# Patient Record
Sex: Male | Born: 1975 | Race: White | Hispanic: No | Marital: Married | Smoking: Current every day smoker
Health system: Southern US, Community
[De-identification: ages and names within clinical notes are randomized; demographics above are authoritative.]

## PROBLEM LIST (undated history)

## (undated) HISTORY — PX: FEMUR CLOSED REDUCTION: SHX939

---

## 2008-06-10 ENCOUNTER — Emergency Department (HOSPITAL_COMMUNITY): Admission: EM | Admit: 2008-06-10 | Discharge: 2008-06-10 | Payer: Self-pay | Admitting: Emergency Medicine

## 2008-10-21 ENCOUNTER — Encounter: Admission: RE | Admit: 2008-10-21 | Discharge: 2008-12-03 | Payer: Self-pay | Admitting: Anesthesiology

## 2008-11-19 ENCOUNTER — Ambulatory Visit: Payer: Self-pay | Admitting: Anesthesiology

## 2008-12-03 ENCOUNTER — Ambulatory Visit: Payer: Self-pay | Admitting: Anesthesiology

## 2009-06-09 ENCOUNTER — Encounter: Admission: RE | Admit: 2009-06-09 | Discharge: 2009-06-09 | Payer: Self-pay | Admitting: Emergency Medicine

## 2010-05-23 ENCOUNTER — Emergency Department (HOSPITAL_COMMUNITY): Admission: EM | Admit: 2010-05-23 | Discharge: 2010-05-23 | Payer: Self-pay | Admitting: Emergency Medicine

## 2011-05-04 NOTE — Assessment & Plan Note (Signed)
PRIMARY CARE PHYSICIAN:  Ellie Lunch, MD   Arthur Castaneda comes to [facility] through the [employer], working for [employer].  Apparently, he injured his low back, and  parathoracic region.  He has been evaluated through orthopedic  colleagues and he has had some interventions, none of which was  particularly helpful.  He has been on quite a bit of hydrocodone from  multiple sources and we have reviewed that, we have done adherence  monitoring, reviewed with him.  He is an individual who comes to [place] with  records that I review as well, review patient care, agreement, health  and history form.  He is currently working, but is relating his pain as  7/10, mostly parathoracic, made worse by standing, most activity,  walking, and bending.  He is able to walk with assistance, continues to  drive.  He is employed 35 hours a week, he states that is difficult.  He  is married, he has kids.   PAST MEDICAL HISTORY:  Remarkable for right elbow fracture, primary care  physician is Dr. Renae Fickle.  His primary care physician and Orthopedics have  been providing him with his pain control and he has also got what  appears to be urgent care.  He is denying any chemical alcohol  dependency; denying illicit drug use; and review of systems, family and  social history, otherwise noncontributory to pain problem.   PHYSICAL EXAMINATION:  GENERAL:  Pleasant male resting comfortably in  bed.  Gait affect appears normal, he is oriented x3.  HEENT:  Unremarkable.  CHEST:  Clear to auscultation and percussion.  HEART:  Regular rate and rhythm without rub, murmur, or gallop.  ABDOMINAL:  Benign.  No hepatosplenomegaly.  He has diffuse parathoracic  myofascial discomfort, some distractability, pain with side bending,  pain with extension.  Intact from a neurological prospective.   IMPRESSION:  Myofascial pain syndrome, degenerative spondylosis of  thoracic spine.   PLAN:  1. He may have a  spondylytic pain, and he does have some bulging disk      in the mid thoracic region, but I do not believe he is a candidate      for control substances over a prolonged period of time.  I do not      believe he would be a candidate for a pharmacokinetically long-      acting agents either, with the exception of non-narcotic medication      alternatives, [place] may be a good choice.  2. Full informed consent.  We asked for UDS today, and he took him a      long time to produce this, and it looks like water and his      temperature is 92 degrees.  He understands I may have to sent that      off to be confirmed.  3. He is probably a candidate for acupuncture and minimally invasive      approaches possibly even a muscle stimulator for agitation.  I do      not think further interventions are going to be of particular      value.  I see no disabling characteristics.  I see essentially      modest-to-minor myofascial pain, and believe he will have an      overall good outcome.  Emphasize enabling behaviors as opposed to      disabling behavior, age is a very positive factor, and      reinforcement  is given here.  We will have him see our Physiatry      colleagues and I have talked him with Dr. Wynn Banker, and I do not      believe we will get into any controlled substances, we will      probably recast him at next visit.           ______________________________  Celene Kras, MD     HH/MedQ  D:  11/19/2008 11:35:34  T:  11/20/2008 01:08:29  Job #:  [id number]

## 2011-05-04 NOTE — Assessment & Plan Note (Signed)
Arthur Castaneda comes to the [facility] today.  I  evaluated his Health and History form and 14-point review of systems,  chaperoned visit.   1. Arthur Castaneda comes in today and I reviewed his UDS with him.  It is      essentially water, and he admits to that, he states he could not      produce urine.  Further discussions, apparently he used his      mother's medications, and I counseled as to the danger of doing      this, and that this type of activity is contradictory to our      ability to continue treating him.  2. I also wanted to enable him.  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  He should be full active, but he      has quite a bit of pain behavior, and I would like him to focus on      returning to full active.  3. Other modifiable features and health profile discussed.  He talks      about getting another FCE, more imaging and the like and I just do      not think it is clinically indicated at this time.  He has a modest      myofascial spondylitic pain, and I want him to be as functional as      possible, and he continues to work, I see no reason he cannot be      full active here.  He has had a previous FCE.   Objectively, mostly a counseling session, myofascial pain.  Nothing new  neurologically.   IMPRESSION:  Spondylosis with myofascial pain and otherwise unchanged.   PLAN:  Unfortunately, we are going to have to discharge him.  We  counseled him.  Offered him drug treatment if he would like, as there  are many red flags, and he declines.  We will not be able to continue  with him and if he wants to go through drug treatment and focus as to  best outcome predictive elements, we might rethink this, but at this  time, I am not going to have a lot to offer.     ______________________________  Celene Kras, MD     HH/MedQ  D:  12/03/2008 11:07:55  T:  12/04/2008 05:35:55  Job #:  [id number]

## 2016-02-12 ENCOUNTER — Emergency Department (HOSPITAL_COMMUNITY)
Admission: EM | Admit: 2016-02-12 | Discharge: 2016-02-13 | Disposition: A | Discharge status: Home / Self Care | Payer: Worker's Compensation | Attending: Emergency Medicine | Admitting: Emergency Medicine

## 2016-02-12 ENCOUNTER — Encounter (HOSPITAL_COMMUNITY): Payer: Self-pay | Admitting: Emergency Medicine

## 2016-02-12 ENCOUNTER — Emergency Department (HOSPITAL_COMMUNITY): Payer: Worker's Compensation

## 2016-02-12 DIAGNOSIS — S92001A Unspecified fracture of right calcaneus, initial encounter for closed fracture: Secondary | ICD-10-CM

## 2016-02-12 DIAGNOSIS — M545 Low back pain, unspecified: Secondary | ICD-10-CM

## 2016-02-12 DIAGNOSIS — F17210 Nicotine dependence, cigarettes, uncomplicated: Secondary | ICD-10-CM | POA: Insufficient documentation

## 2016-02-12 DIAGNOSIS — Y9289 Other specified places as the place of occurrence of the external cause: Secondary | ICD-10-CM | POA: Diagnosis not present

## 2016-02-12 DIAGNOSIS — Y9389 Activity, other specified: Secondary | ICD-10-CM | POA: Diagnosis not present

## 2016-02-12 DIAGNOSIS — S3992XA Unspecified injury of lower back, initial encounter: Secondary | ICD-10-CM | POA: Diagnosis present

## 2016-02-12 DIAGNOSIS — S92061A Displaced intraarticular fracture of right calcaneus, initial encounter for closed fracture: Secondary | ICD-10-CM | POA: Insufficient documentation

## 2016-02-12 DIAGNOSIS — Y998 Other external cause status: Secondary | ICD-10-CM | POA: Insufficient documentation

## 2016-02-12 DIAGNOSIS — S32010A Wedge compression fracture of first lumbar vertebra, initial encounter for closed fracture: Secondary | ICD-10-CM | POA: Diagnosis not present

## 2016-02-12 DIAGNOSIS — W11XXXA Fall on and from ladder, initial encounter: Secondary | ICD-10-CM | POA: Diagnosis not present

## 2016-02-12 MED ORDER — HYDROMORPHONE HCL 1 MG/ML IJ SOLN
1.0000 mg | Freq: Once | INTRAMUSCULAR | Status: AC
  Administered 2016-02-12: 1 mg via INTRAVENOUS
  Filled 2016-02-12: qty 1

## 2016-02-12 MED ORDER — OXYCODONE HCL 10 MG PO TABS: 10.0000 mg | ORAL_TABLET | ORAL | Status: AC | PRN

## 2016-02-12 MED ORDER — OXYCODONE HCL 5 MG PO TABS
10.0000 mg | ORAL_TABLET | Freq: Once | ORAL | Status: AC
  Administered 2016-02-12: 10 mg via ORAL
  Filled 2016-02-12: qty 2

## 2016-02-12 MED ORDER — ACETAMINOPHEN 500 MG PO TABS
1000.0000 mg | ORAL_TABLET | Freq: Once | ORAL | Status: AC
  Administered 2016-02-12: 1000 mg via ORAL
  Filled 2016-02-12: qty 2

## 2016-02-12 MED ORDER — FENTANYL CITRATE (PF) 100 MCG/2ML IJ SOLN
100.0000 ug | INTRAMUSCULAR | Status: AC | PRN
  Administered 2016-02-12 (×3): 100 ug via INTRAVENOUS
  Filled 2016-02-12 (×3): qty 2

## 2016-02-12 NOTE — ED Provider Notes (Signed)
CSN: [id number]     Arrival date & time 02/12/16  1550 History   First MD Initiated Contact with Patient 02/12/16 1550     Chief Complaint  Patient presents with  . Fall     (Consider location/radiation/quality/duration/timing/severity/associated sxs/prior Treatment) The history is provided by the patient and medical records. No language interpreter was used.     Arthur Castaneda is a 40 y.o. male  with no major medical problems presents to the Emergency Department complaining of acute, persistent right ankle and low back pain after falling 24 feet from a ladder. Patient reports that tach the latter was sitting on was wet and the bottom of the latter began to slip. He "rode the ladder all the way to the bottom" striking his right foot first and then landing on his buttock. Patient denies hitting his head. He reports a history of low back pain but this is worse. Patient reports primary concern is a right ankle pain. He has not been ambulatory since the fall. Patient arrives fully immobilized via EMS due to midline tenderness to palpation on initial exam.  Denies hip or upper leg pain, upper back pain, neck pain, changes in vision, syncope.  Patient reports associated pain and her seizures to the right foot.  Palpation of the foot and ankle make the symptoms worse.  Not provided by EMS has improved the pain some.       History reviewed. No pertinent past medical history. Past Surgical History  Procedure Laterality Date  . Femur closed reduction     No family history on file. Social History  Substance Use Topics  . Smoking status: Current Every Day Smoker    Types: Cigarettes  . Smokeless tobacco: None  . Alcohol Use: No    Review of Systems  Constitutional: Negative for fever and chills.  HENT: Negative for dental problem, facial swelling and nosebleeds.   Eyes: Negative for visual disturbance.  Respiratory: Negative for cough, chest tightness, shortness of breath, wheezing and  stridor.   Cardiovascular: Negative for chest pain.  Gastrointestinal: Negative for nausea, vomiting and abdominal pain.  Genitourinary: Negative for dysuria, hematuria and flank pain.  Musculoskeletal: Positive for back pain ( low), joint swelling (right ankle) and arthralgias ( right ankle). Negative for gait problem, neck pain and neck stiffness.  Skin: Negative for rash and wound.  Neurological: Negative for syncope, weakness, light-headedness, numbness and headaches.  Hematological: Does not bruise/bleed easily.  Psychiatric/Behavioral: The patient is not nervous/anxious.   All other systems reviewed and are negative.     Allergies  Review of patient's allergies indicates no known allergies.  Home Medications   Prior to Admission medications   Medication Sig Start Date End Date Taking? Authorizing Provider  Oxycodone HCl 10 MG TABS Take 1 tablet (10 mg total) by mouth every 4 (four) hours as needed for severe pain. 02/12/16   Masson Nalepa, PA-C   BP 138/84 mmHg  Pulse 56  Temp(Src) 98.3 F (36.8 C) (Oral)  Resp 16  Ht  (1.753 m)  Wt 65.772 kg  BMI 21.40 kg/m2  SpO2 98% Physical Exam  Constitutional: He is oriented to person, place, and time. He appears well-developed and well-nourished. No distress.  HENT:  Head: Normocephalic and atraumatic.  Nose: Nose normal.  Mouth/Throat: Uvula is midline, oropharynx is clear and moist and mucous membranes are normal.  Eyes: Conjunctivae and EOM are normal. Pupils are equal, round, and reactive to light.  Neck: No spinous process tenderness  and no muscular tenderness present. No rigidity. Normal range of motion present.  C-collar removed with no midline or paraspinal cervical tenderness subsequent full ROM without pain No crepitus, deformity or step-offs  Cardiovascular: Normal rate, regular rhythm, normal heart sounds and intact distal pulses.   Pulses:      Radial pulses are 2+ on the right side, and 2+ on the left  side.       Dorsalis pedis pulses are 2+ on the right side, and 2+ on the left side.       Posterior tibial pulses are 2+ on the right side, and 2+ on the left side.  Pulmonary/Chest: Effort normal and breath sounds normal. No accessory muscle usage. No respiratory distress. He has no decreased breath sounds. He has no wheezes. He has no rhonchi. He has no rales. He exhibits no tenderness and no bony tenderness.  No contusion or ecchymosis No flail segment, crepitus or deformity Equal chest expansion  Abdominal: Soft. Normal appearance and bowel sounds are normal. There is no tenderness. There is no rigidity, no guarding and no CVA tenderness.  No contusion or ecchymosis Abd soft and nontender  Musculoskeletal: Normal range of motion.       Thoracic back: He exhibits normal range of motion.       Lumbar back: He exhibits normal range of motion.  Full range of motion of the T-spine and L-spine No tenderness to palpation of the spinous processes of the T-spine  Mild midline tenderness of the upper L spine No crepitus, deformity or step-offs Mild tenderness to palpation of the paraspinous muscles of the L-spine Pelvis stable FROM of bilateral hips without exacerbation of pain FROM of bilateral knees without pain Palpable deformity of the calcaneous with swelling and TTP.    Lymphadenopathy:    He has no cervical adenopathy.  Neurological: He is alert and oriented to person, place, and time. He has normal reflexes. No cranial nerve deficit. GCS eye subscore is 4. GCS verbal subscore is 5. GCS motor subscore is 6.  Reflex Scores:      Bicep reflexes are 2+ on the right side and 2+ on the left side.      Brachioradialis reflexes are 2+ on the right side and 2+ on the left side.      Patellar reflexes are 2+ on the right side and 2+ on the left side.      Achilles reflexes are 2+ on the right side and 2+ on the left side. Speech is clear and goal oriented, follows commands Normal 5/5 strength  in upper and lower extremities bilaterally including dorsiflexion and plantar flexion, strong and equal grip strength Sensation normal to light and sharp touch Moves extremities without ataxia, coordination intact Gait testing deferred  Skin: Skin is warm and dry. No rash noted. He is not diaphoretic. No erythema.  Psychiatric: He has a normal mood and affect.  Nursing note and vitals reviewed.   ED Course  Procedures (including critical care time) Labs Review Labs Reviewed - No data to display  Imaging Review Dg Lumbar Spine Complete  02/12/2016  CLINICAL DATA:  Initial evaluation for acute low back pain.  Fall. EXAM: LUMBAR SPINE - COMPLETE 4+ VIEW COMPARISON:  None. FINDINGS: Five non-rib-bearing lumbar-type vertebral bodies are present. Vertebral bodies are normally aligned with preservation of the normal lumbar lordosis. There is height loss at the superior endplate of L1, suspicious for acute compression fracture. There is approximately 30-40% of height loss without significant bony  retropulsion. No malalignment. Vertebral body heights otherwise maintained. Sacrum intact. Visualized soft tissues demonstrate no acute abnormality. No significant degenerative changes within the lumbar spine. IMPRESSION: 1. Height loss at the superior endplate of L1, concerning for acute compression fracture. There is approximately 30% height loss without significant bony retropulsion. No malalignment. 2. No radiographic evidence for other acute traumatic injury. Electronically Signed   By: Rise Mu M.D.   On: 02/12/2016 20:45   Dg Ankle Complete Right  02/12/2016  CLINICAL DATA:  Right heel and lateral ankle pain after falling 24 feet from ladder today. EXAM: RIGHT ANKLE - COMPLETE 3+ VIEW COMPARISON:  None. FINDINGS: There is a comminuted and impacted intra-articular compression fracture of the calcaneus. This extends into the posterior subtalar joint. The tuberosity is deformed posteriorly. There  is displacement of the lateral calcaneal cortex with adjacent soft tissue swelling. The distal tibia, distal fibula and talus appear intact. IMPRESSION: Comminuted and impacted intra-articular compression fracture of the calcaneus. Electronically Signed   By: Carey Bullocks M.D.   On: 02/12/2016 17:42   Ct Lumbar Spine Wo Contrast  02/12/2016  CLINICAL DATA:  Back pain after fall from ladder earlier today. L1 fracture on radiograph. EXAM: CT LUMBAR SPINE WITHOUT CONTRAST TECHNIQUE: Multidetector CT imaging of the lumbar spine was performed without intravenous contrast administration. Multiplanar CT image reconstructions were also generated. COMPARISON:  Radiographs earlier this day. FINDINGS: Acute L1 compression deformity primarily involving the anterior superior endplate. Approximately 30% loss of height anteriorly. There is minimal buckling of the posterior cortex but no significant retropulsion. No involvement of the posterior elements. No additional acute fracture of the lumbar spine. Other than minimal focal kyphosis at the fracture level, lumbar spine alignment is maintained. There is no spinal canal or neural foraminal stenosis at any level. Incidental soft tissue imaging of the abdomen demonstrates a large stool burden. IMPRESSION: Acute L1 compression fracture with approximately 30% loss of height anteriorly. Minimal cortical buckling of the posterior cortex without retropulsion. Electronically Signed   By: Rubye Oaks M.D.   On: 02/12/2016 23:01   Ct Foot Right Wo Contrast  02/12/2016  CLINICAL DATA:  Fall from ladder today. Evaluate calcaneal fracture. EXAM: CT OF THE RIGHT FOOT WITHOUT CONTRAST TECHNIQUE: Multidetector CT imaging of the right foot was performed according to the standard protocol. Multiplanar CT image reconstructions were also generated. COMPARISON:  Radiograph same day. FINDINGS: There is a markedly comminuted and impacted intra-articular compression fracture of the  calcaneus. This has a tongue-type configuration. The posterior facet of the subtalar joint is significantly impacted laterally by up to 10 mm. Fractures extend into the medial facet and calcaneocuboid articulations, although these components are nearly nondisplaced. There is marked flattening of Boehler's angle (near 0 degrees). The medial and lateral cortices of the calcaneal body are significantly displaced. However, no entrapment of the peroneal or medial flexor tendons is seen. Aside from a possible minimal avulsion fracture of the posterior process, the talus appears intact. The navicular, cuboid, cuneiform bones and metatarsals are intact. The alignment is normal at the Lisfranc joint. The distal fibula and tibia are intact. There is soft tissue swelling throughout the hindfoot, extending over the lateral malleolus. IMPRESSION: Comminuted and markedly impacted intra-articular compression fracture of the calcaneus as described, demonstrating a tongue-type configuration. No other significant tarsal bone fractures identified. Electronically Signed   By: Carey Bullocks M.D.   On: 02/12/2016 19:57   Dg Foot Complete Right  02/12/2016  CLINICAL DATA:  Right heel  and lateral ankle pain after falling 24 feet from ladder today. EXAM: RIGHT FOOT COMPLETE - 3+ VIEW COMPARISON:  None. FINDINGS: There is a comminuted and impacted intra-articular compression fracture of the calcaneus. This extends into the posterior subtalar joint. No other tarsal bone fractures are identified. No acute osseous findings are seen within the forefoot. The alignment is normal at the Lisfranc joint. IMPRESSION: Impacted intra-articular compression fracture of the calcaneus. Electronically Signed   By: Carey Bullocks M.D.   On: 02/12/2016 17:40   I have personally reviewed and evaluated these images and lab results as part of my medical decision-making.   MDM   Final diagnoses:  Calcaneus fracture, right  Compression fracture of L1  lumbar vertebra, closed, initial encounter (HCC)  Midline low back pain without sciatica  Fall from ladder, initial encounter   Arthur Castaneda presents with right ankle and low back pain after fall.  Pt did not land directly on his feet.    Plain films of the right ankle and foot show impacted intra-articular compression fracture of the calcaneus.  6:25 PM Discussed with Dr. Charlann Boxer. He recommends outpatient management with well-padded posterior splint, nonweightbearing. CT scan of the foot pending.  L spine films with height loss of the superior endplate of L1 concerning for acute compression fracture. CT scan was obtained which confirms acute L1 compression with minimal cortical buckling and without retropulsion. Patient remains neurologically intact.  Images were reviewed by Dr. Fredderick Phenix and myself.  Pt without neurologic deficits.  He is to follow with Dr. Victorino Dike for further evaluation of his calcaneal fracture and [place] [facility] for continued follow-up of his L1 compression fracture. Patient discharged home with pain medications.  Weightbearing and lifting restrictions discussed with patient. The patient and friends at bedside reported understanding. Return precautions given for weakness, loss of bowel or bladder control, worsening of back pain or other concerns.  BP 138/84 mmHg  Pulse 56  Temp(Src) 98.3 F (36.8 C) (Oral)  Resp 16  Ht  (1.753 m)  Wt 65.772 kg  BMI 21.40 kg/m2  SpO2 98%   Dierdre Forth, PA-C 02/13/16 0037  Rolan Bucco, MD 02/14/16 [phone]

## 2016-02-12 NOTE — Discharge Instructions (Signed)
1. Medications: Oxycodone 1 tablet every 4 hours, tylenol 1000mg  every 8 hours in addition to the oxycodone for additional pain control, usual home medications 2. Treatment: rest, drink plenty of fluids, ice, elevate, DO NOT walk on your foot.   3. Follow Up: Please followup with Dr. Victorino Dike in 5-7 days for discussion of your diagnoses and further evaluation after today's visit; if you do not have a primary care doctor use the resource guide provided to find one; Please return to the ER for worsening symptoms, difficulty with leg usage, loss of bowel or bladder control or other concerns    Calcaneal Fracture Repair There are many different ways of treating fractures of the large irregular bone in the foot that makes up the heel of the foot (calcaneus). Calcaneal fractures can be treated with:   Immobilization--The fracture is casted as it is without changing the positions of the fracture involved.   Closed reduction--The bones are manipulated back into position without opening the site of the fracture using surgery.   Open reduction and internal fixation--The fracture site is opened and the bone pieces are fixed into place with some type of hardware (such as a screw).   Primary arthrodesis--The joint has enough damage that a procedure is done as the first treatment which will leave the joint permanently stiff. This will decrease function, however usually will leave the joint pain free. LET [facility] CARE PROVIDER KNOW ABOUT:  Any allergies you have.   All medicines you are taking, including vitamins, herbs, eye drops, creams, and over-the-counter medicines.   Previous problems you or members of your family have had with the use of anesthetics.  Any blood disorders you have.   Previous surgeries you have had.   Medical conditions you have.  RISKS AND COMPLICATIONS Generally, calcaneal fracture repair is a safe procedure. However, as with any procedure, complications can occur.  Possible complications include:   Swelling of the foot and ankle.  Infection of the wound or bone.  Arthritis.  Chronic pain of the foot.  Nerve injury.  Blood clot in the legs or lungs. BEFORE THE PROCEDURE  Ask your health care provider about changing or stopping your regular medicines. You may need to stop taking certain medicines, such as aspirin or blood thinners, at least 1 week before the surgery.  X-rays and any imaging studies are reviewed with your healthcare provider. The surgeon will advise you on the best surgical approach to repair your fracture.  Do not eat or drink anything for at least 8 hours before the surgery or as directed by your health care provider.   If you smoke, do not smoke for at least 2 weeks before the surgery.   Make plans to have someone drive you home after the procedure. Also arrange for someone to help you with activities during recovery.  PROCEDURE   You will be given medicine to help you relax (sedative). You will then be given medicine to make you sleep through the procedure (general anesthetic). These medicines will be given through an IV access tube that is put into one of your veins.   A nerve block or numbing medicine (local anesthetic) may also be used to keep you comfortable.  Once you are asleep, the foot will be cleaned and shaved if needed.  The surgeon may use a percutaneous or open technique for this surgery:  In the percutaneous approach, small cuts and pins are used to repair the fracture.  In the open technique, a  cut is made along the outside of the foot and the bone pieces are placed back together with hardware. A drain may be left to collect fluid. It is removed 3-4 days after the procedure.  The surgeon then uses staples or stitches to close the incision or cuts. AFTER THE PROCEDURE  After surgery you will be taken to the recovery area where a nurse will watch and check your progress for 1-3 hours. Once you are  awake, stable, and taking fluids well, and if you do not have any other problems, you will be allowed to go home.  You will be given pain medicine if needed.  The IV access tube will be removed before you are discharged.   This information is not intended to replace advice given to you by your health care provider. Make sure you discuss any questions you have with your health care provider.   Document Released: 09/15/2005 Document Revised: 09/26/2013 Document Reviewed: 07/10/2013 Elsevier Interactive Patient Education 2016 [employer].    Emergency Department Resource Guide 1) Find a Doctor and Pay Out of Pocket Although you won't have to find out who is covered by your insurance plan, it is a good idea to ask around and get recommendations. You will then need to call the office and see if the doctor you have chosen will accept you as a new patient and what types of options they offer for patients who are self-pay. Some doctors offer discounts or will set up payment plans for their patients who do not have insurance, but you will need to ask so you aren't surprised when you get to your appointment.  2) Contact Your Local Health Department Not all health departments have doctors that can see patients for sick visits, but many do, so it is worth a call to see if yours does. If you don't know where your local health department is, you can check in your phone book. The CDC also has a tool to help you locate your state's health department, and many state websites also have listings of all of their local health departments.  3) Find a Walk-in Clinic If your illness is not likely to be very severe or complicated, you may want to try a walk in clinic. These are popping up all over the country in pharmacies, drugstores, and shopping centers. They're usually staffed by nurse practitioners or physician assistants that have been trained to treat common illnesses and complaints. They're usually fairly  quick and inexpensive. However, if you have serious medical issues or chronic medical problems, these are probably not your best option.  No Primary Care Doctor: - Call Health Connect at  [phone] - they can help you locate a primary care doctor that  accepts your insurance, provides certain services, etc. - Physician Referral Service- [phone]  Chronic Pain Problems: Organization         Address  Phone   Notes  [facility]  [phone] Patients need to be referred by their primary care doctor.   Medication Assistance: Organization         Address  Phone   Notes  [facility] Medication [facility] [address] [place] [place], [place] [address] [phone] --Must be a resident of [facility] -- Must have NO insurance coverage whatsoever (no Medicaid/ Medicare, etc.) -- The pt. MUST have a primary care doctor that directs their care regularly and follows them in the community   MedAssist  [phone]  [employer]  [phone]    Agencies that provide inexpensive medical care: Organization         Address  Phone   Notes  [facility]  [phone]   [facility]    [phone]   [facility] [address] [place], [place] [address] [phone]   [facility] [address] [place] [phone]   Planned Parenthood    [phone]   [facility]    [phone]   [facility]  [address], [place] Phone:  [phone], Fax:  [phone] Hours of Operation:  9 am - 6 pm, M-F.  Also accepts Medicaid/Medicare and self-pay.  [facility]  [address], [place] Phone: [phone], Fax: [phone]. Hours of Operation:  8:30 am - 5:30 pm, M-F.  Also accepts Medicaid and self-pay.  [facility] [place] [address] [place] Phone: [phone]    [facility] [address] Natasha Bence [place], [place] [phone], Ext. [phone] Mondays & Thursdays: 7-9 AM.  First 15 patients are seen on a first come, first serve basis.    Medicaid-accepting [facility] Providers:  Organization         Address  Phone   Notes  [facility] [address], [place] [phone] Also accepts self-pay patients.  [facility] [address] Laurell Josephs [place], [place]  [phone]   [facility] [address], [place] [phone]   [facility] [address], [place] [phone]   Renaye Rakers [address], [place]   [phone] Only accepts [place] patients after they have their name applied to their card.   Self-Pay (no insurance) in [facility]:  Organization         Address  Phone   Notes  Sickle Cell Patients, [facility] [address] [place], [place] [phone]   [facility] [address] [place], [place] [phone]   [facility] [place]  [address], [place] [phone]   [facility]/Dr. Osei-Bonsu  [address], [place] or [address], [place] [phone] Phone number for both [place] and [place] locations is the same.  [facility] [address], [place] [phone]   [facility] [address] [place] or [address] [phone]   [facility] [address], [place] [phone], phone; [phone], fax Sees patients 1st and 3rd Saturday of every month.  Must not qualify for public or private insurance (i.e. Medicaid, Medicare, [place] Health Choice, Veterans' Benefits)  Household income should be no more than 200% of the poverty level The clinic cannot treat you if you are  pregnant or think you are pregnant  Sexually transmitted diseases are not treated at the clinic.    Dental Care: Organization         Address  Phone  Notes  [facility] Department of [facility] [address] [place], [place] [phone] Accepts children up to age 21 who are enrolled in [place] or [place] Health Choice; pregnant women with a Medicaid card;  and children who have applied for Medicaid or [place] Health Choice, but were declined, whose parents can pay a reduced fee at time of service.  [facility] Department of [facility]  [address], [place] [phone] Accepts children up to age 17 who are enrolled in [place] or  Health Choice; pregnant women with a Medicaid card; and children who have applied for Medicaid or  Health Choice, but were declined, whose parents can pay a reduced fee at time of service.  [facility]  [address] [place], [place] [phone] Patients are seen by appointment only. Walk-ins are not accepted. [facility] will see patients 36 years of age and older. Monday - Tuesday (8am-5pm) Most Wednesdays (8:30-5pm) $30 per visit, cash only  [facility]  [address], [facility] [phone] Patients are seen by appointment only. Walk-ins are not accepted. [facility] will see patients 44 years of age and older. One Wednesday Evening (Monthly: Volunteer Based).  $30 per visit, cash only  [employer]  [phone] for adults; Children under age 36, call Graduate Pediatric Dentistry at [phone]. Children aged 35-14, please call [phone] to request a pediatric application.  Dental services are provided in all areas of dental care including fillings, crowns and bridges, complete and partial dentures, implants, gum treatment, root canals, and extractions. Preventive care is also provided. Treatment is provided to  both adults and children. Patients are selected via a lottery and there is often a waiting list.   [facility] [address] [place]  [phone] [email or web address]   [facility] [address] [place], [place] [phone], Ext. [phone] Second and Fourth Thursday of each month, opens at 6:30 AM; Clinic ends at 9 AM.  Patients are seen on a first-come first-served basis, and a limited number are seen during each clinic.   [facility]  [address] Ether Griffins [place], [place] [phone]   Eligibility Requirements You must have lived in [place], [place], or [place] counties for at least the last three months.   You cannot be eligible for state or federal sponsored [place], including [employer], [place], or [employer].   You generally cannot be eligible for healthcare insurance through your employer.    How to apply: Eligibility screenings are held every Tuesday and Wednesday afternoon from 1:00 pm until 4:00 pm. You do not need an appointment for the interview!  [facility] [address], [place], [place] [phone]   [facility] Health Department  [phone]   [facility]  [phone]   [facility] Health Department  [phone]    Behavioral Health Resources in the Community: Intensive Outpatient Programs Organization         Address  Phone  Notes  [facility] [address], [place], [place] [phone]   [facility] Out[facility] [address] [place], [place] [phone]   [facility] [address] [place], [place]  [phone]   [facility] [address],  [place], [place] [phone] or [phone]   Substance Abuse Resources Organization         Address  Phone  Notes  [facility]  [phone]   [facility]  [phone]   [facility] [place]   [phone]   [facility]  [phone]  Residential & Outpatient Substance Abuse Program  [phone]   Psychological Services Organization         Address  Phone  Notes  [facility]  [phone]- [phone]   [facility]  [phone]- [phone]   [facility] [phone], [place] [phone] or [phone]    Mobile Crisis Teams Organization         Address  Phone  Notes  [facility], Mobile Crisis Care Unit  [phone]   [facility]  [address] [place], [place] [phone]   Doristine LocksSharon DeEsch [address] [place] [phone]    Self-Help/Support Groups Organization         Address  Phone             Notes  [facility] [place] - variety of support groups  336- [phone] Call for more information  Narcotics Anonymous (NA), [facility] [address], [place]  2 meetings at this location   Statisticianesidential Treatment Programs Organization         Address  Phone  Notes  [facility] [address],    [place]  [phone]   [facility]  [address], [place] [phone], [place], [place] [phone]   [facility] [address], [place] [phone] Admissions: 8am-3pm M-F  [facility] [address]    [place], [place] [phone]   [facility] [address], [place], [place] [phone]   [facility] [address]  [place], [place] [phone]   [facility] - Intensive Outpatient [address], [place], [place] [phone]   [facility] ([facility] [address]  [place], [place] [phone] or [phone]   [facility] ([facility]) [address] [place], [place] [phone] Accepts Medicaid  [facility] [address]  [place] [phone] Substance Abuse/Addiction Treatment   [facility] Behavioral Health Resources Organization         Address  Phone  Notes  [facility]  [phone]   Angie FavaJulie Brannon, PhD [address] [place], [place]   [phone] or [phone](336) [phone]   [facility]   [address] [place], [place] [phone](336) [phone]   [facility] [address], [place], [place] [phone] Insurance/Medicaid/sponsorship through [facility]  [facility] [address]                                    [place], [place] [phone] Therapy/tele-psych/case  [facility] [address].   [place], [place] [phone]    Dr. Lolly MustacheArfeen  [phone]   [facility]  [employer] [facility] 1) [address], [place] 2) [address], [place] 3)  [address], [place] [phone](336) [phone]  [phone]   [facility] Child Abuse Hotline [phone]) 9516883699 or [phone]) [phone] (After Hours)

## 2016-02-12 NOTE — Progress Notes (Signed)
Orthopedic Tech Progress Note Patient Details:  TYLIK TREESE Mar 08, 1976 [id number]  Ortho Devices Type of Ortho Device: Ace wrap, Crutches, Post (short leg) splint Ortho Device/Splint Location: RLE Ortho Device/Splint Interventions: Ordered, Application   Miroslav, Gin 02/12/2016, 8:02 PM

## 2016-02-12 NOTE — ED Notes (Signed)
Pt fell approx 24 feet "rode the ladder all the way to the bottom" from ladder while cleaning gutters. Pt arrived on back board, c-collar intact-- all removed on arrival with [place], [place]. maintined C-spine precautions, able to MAE after removal. Pt's only complaint is right ankle pain-- slight swelling to medial aspect of foot. Positive pedal pulse.

## 2017-02-05 IMAGING — CT CT FOOT*R* W/O CM
3 of 4 series · 13 of 36 positions shown, 14 images · non-contrast
Comparison: Radiograph same day.

CLINICAL DATA: Fall from ladder today. Evaluate calcaneal fracture.

EXAM:
CT OF THE RIGHT FOOT WITHOUT CONTRAST
TECHNIQUE: Multidetector CT imaging of the right foot was performed according
to the standard protocol. Multiplanar CT image reconstructions were
also generated.

[Series 4: lower ext 1.5 st · axial · 0.53mm/px · z∈[-183,+8]mm · 4 of 185 slices shown, 5 images]
[im 29/185  soft-tissue]
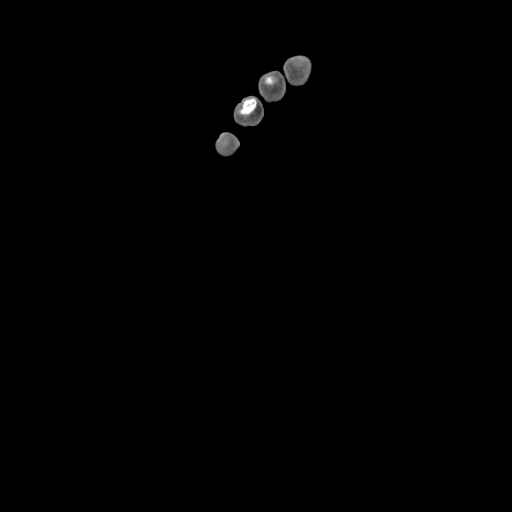
[im 29/185  bone]
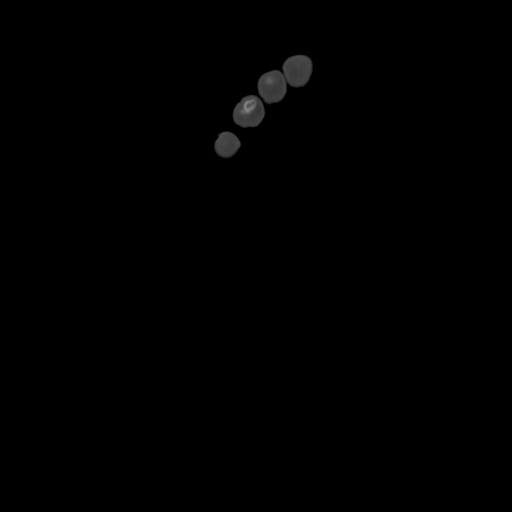
[im 71/185  bone]
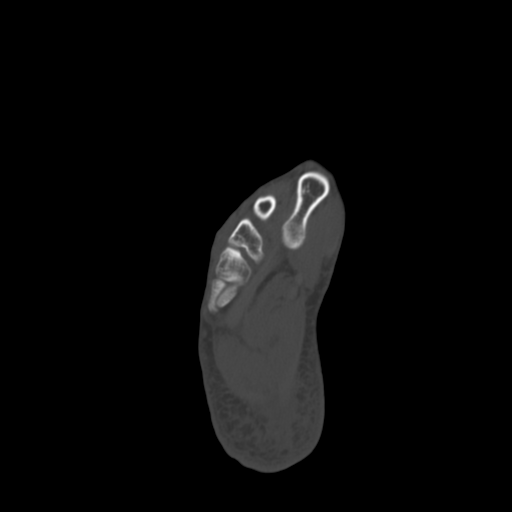
[im 114/185  bone]
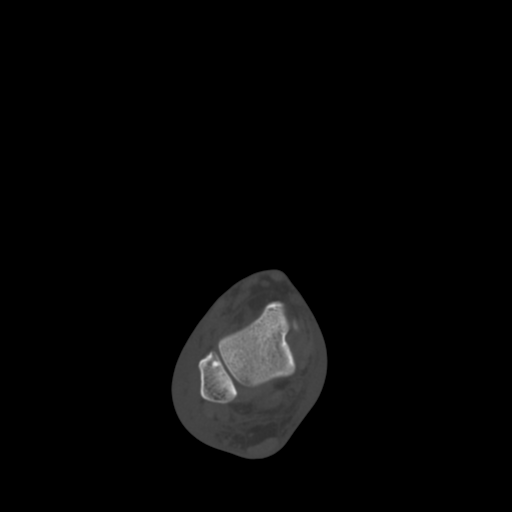
[im 156/185  bone]
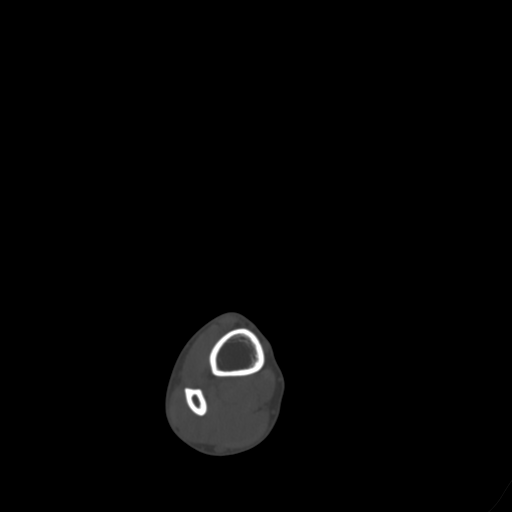

[Series 502: bone sagittal 2 · sagittal · 0.51mm/px · 6 of 154 slices shown]
[im 33/154  soft-tissue]
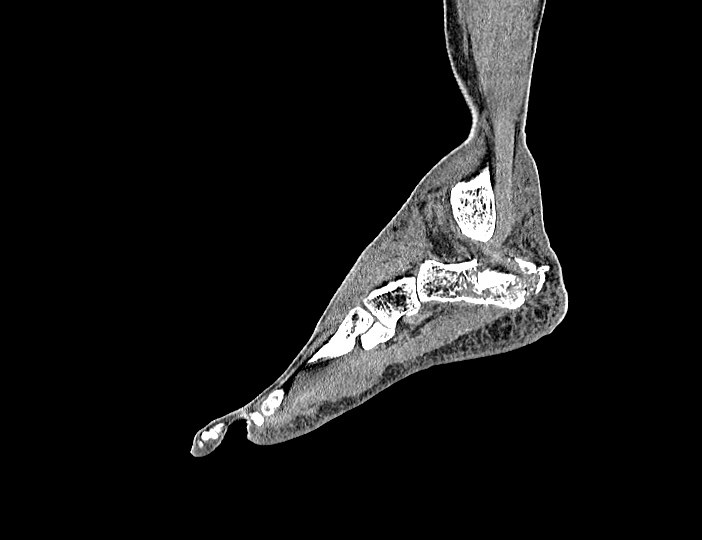
[im 39/154  bone]
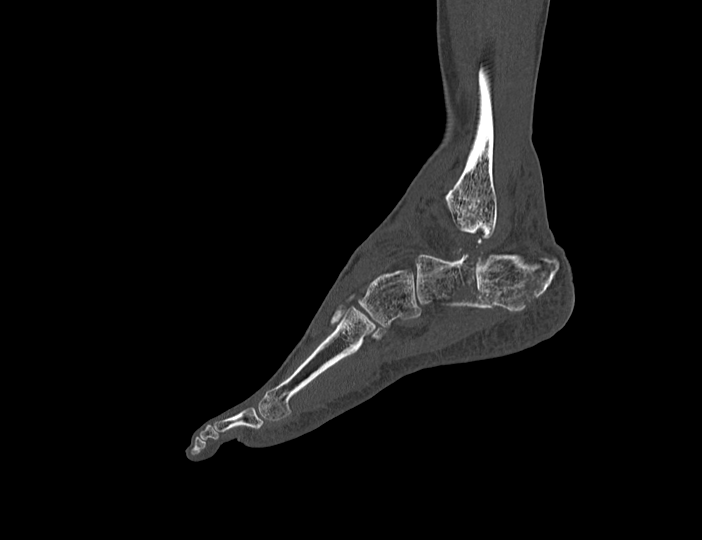
[im 58/154  bone]
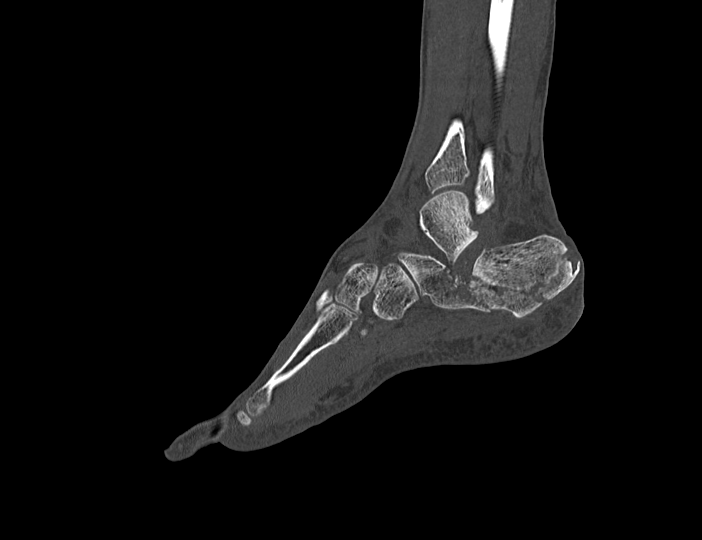
[im 77/154  bone]
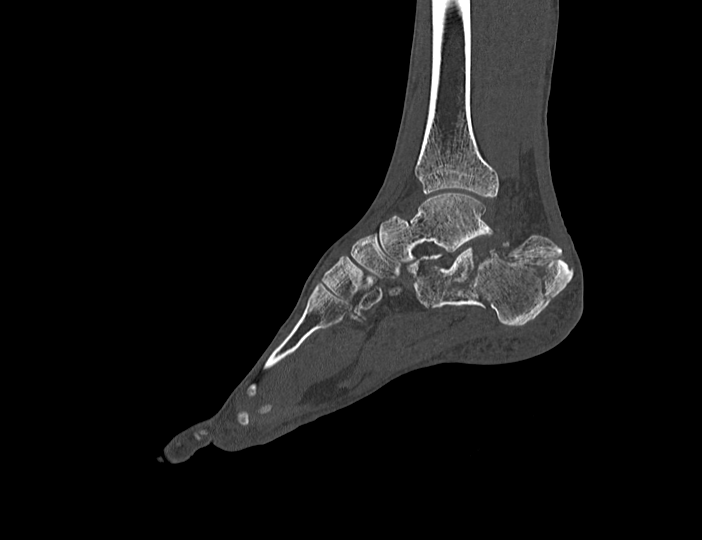
[im 96/154  bone]
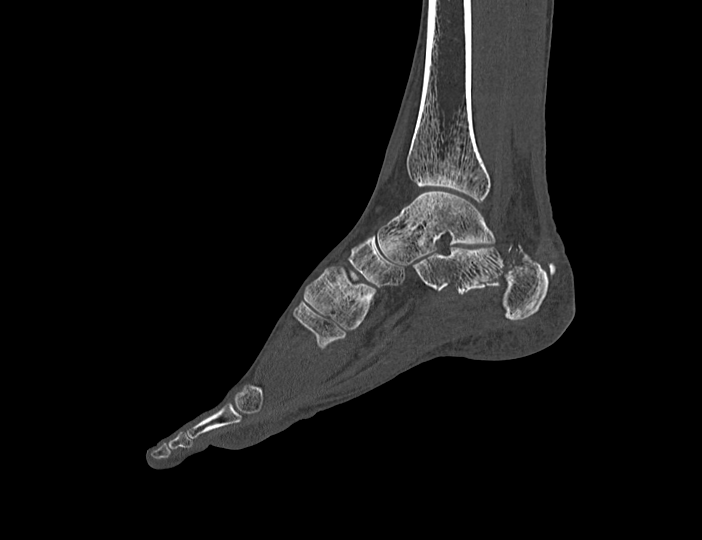
[im 115/154  bone]
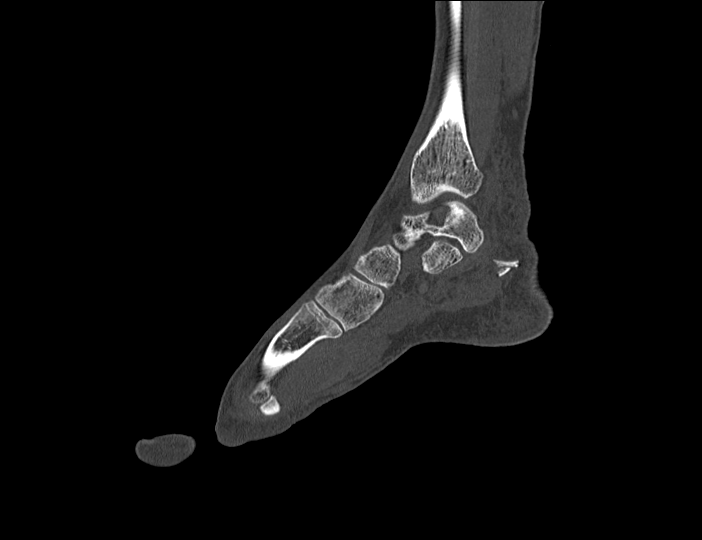

[Series 503: coronal bone 2 · coronal · 0.50mm/px · 3 of 316 slices shown]
[im 64/316  bone]
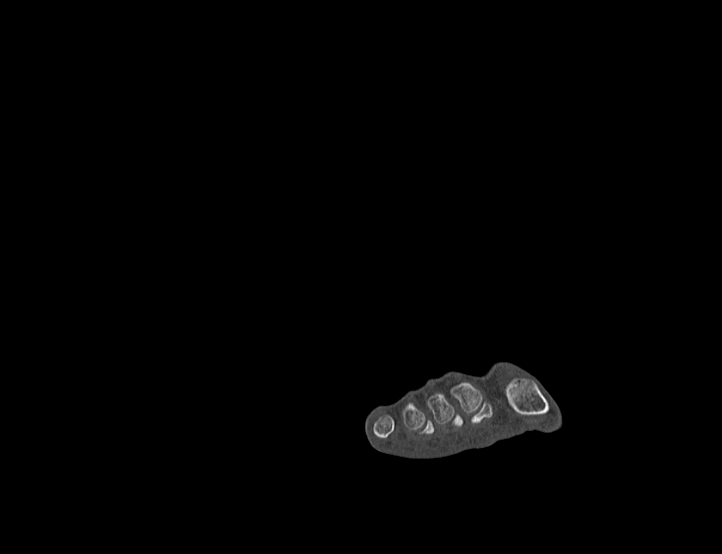
[im 127/316  bone]
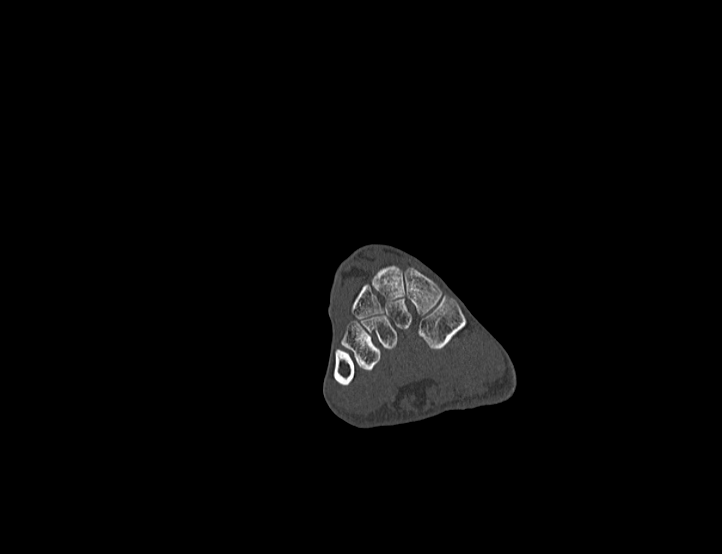
[im 190/316  bone]
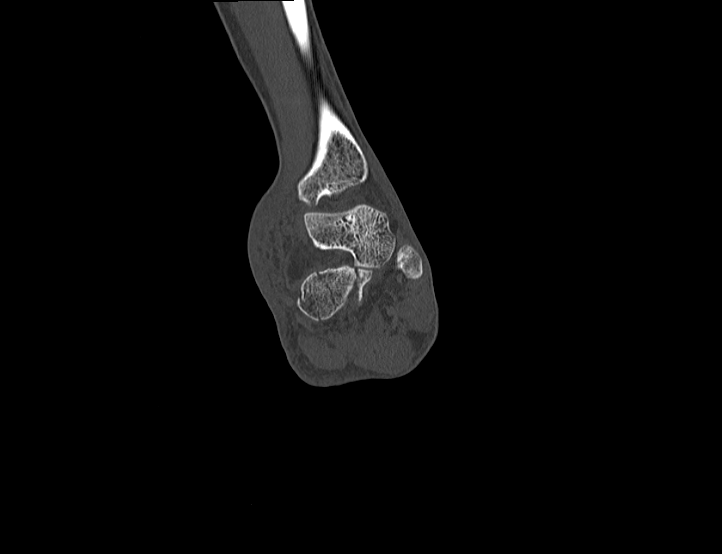

[13 of 36 positions shown; findings below may reference images not displayed]

FINDINGS: There is a markedly comminuted and impacted intra-articular
compression fracture of the calcaneus. This has a tongue-type
configuration. The posterior facet of the subtalar joint is
significantly impacted laterally by up to 10 mm. Fractures extend
into the medial facet and calcaneocuboid articulations, although
these components are nearly nondisplaced. There is marked flattening
of Boehler's angle (near 0 degrees). The medial and lateral cortices
of the calcaneal body are significantly displaced. However, no
entrapment of the peroneal or medial flexor tendons is seen.

Aside from a possible minimal avulsion fracture of the posterior
process, the talus appears intact. The navicular, cuboid, cuneiform
bones and metatarsals are intact. The alignment is normal at the
Lisfranc joint. The distal fibula and tibia are intact. There is
soft tissue swelling throughout the hindfoot, extending over the
lateral malleolus.
IMPRESSION: Comminuted and markedly impacted intra-articular compression
fracture of the calcaneus as described, demonstrating a tongue-type
configuration. No other significant tarsal bone fractures
identified.

## 2017-02-05 IMAGING — CR DG FOOT COMPLETE 3+V*R*
3 series · 3 of 3 positions shown · non-contrast
Comparison: None.

CLINICAL DATA: Right heel and lateral ankle pain after falling 24
feet from ladder today.

EXAM:
RIGHT FOOT COMPLETE - 3+ VIEW

[foot ap]
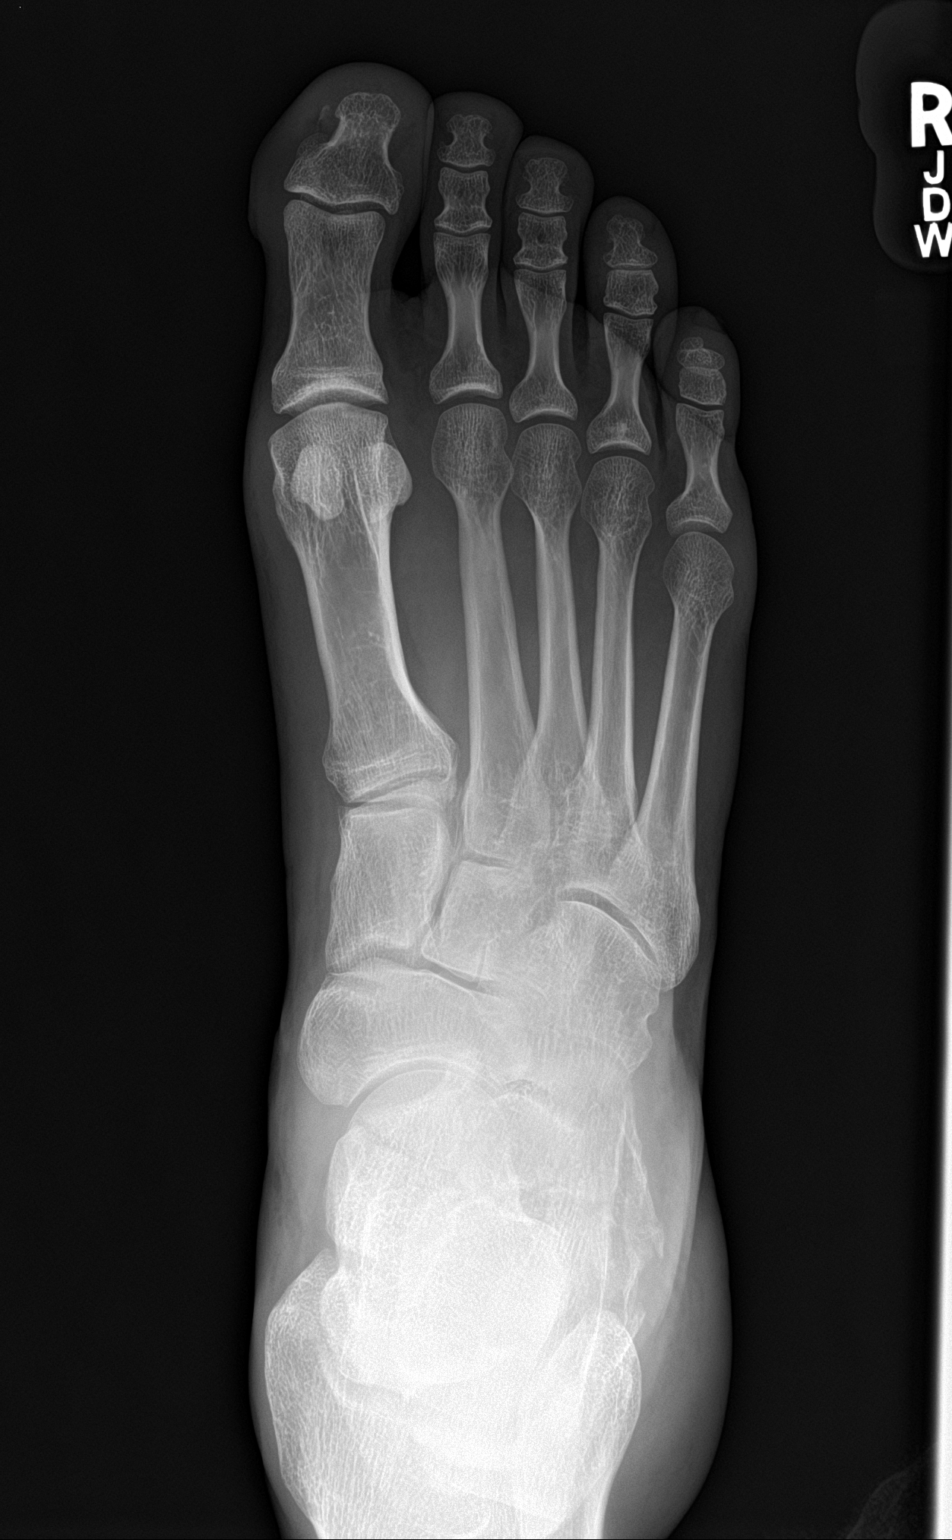

[foot obl]
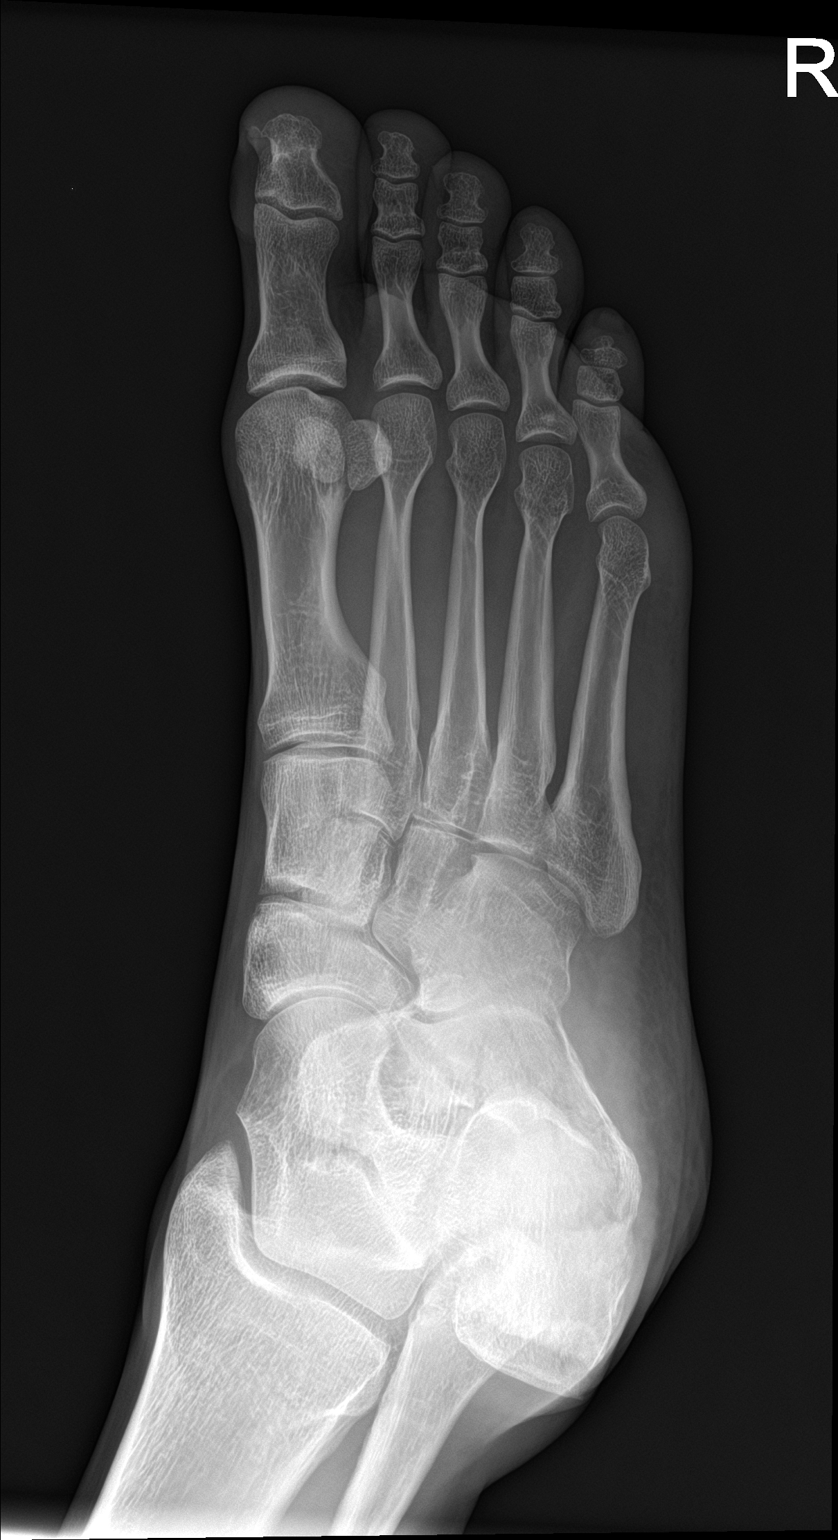

[foot lat]
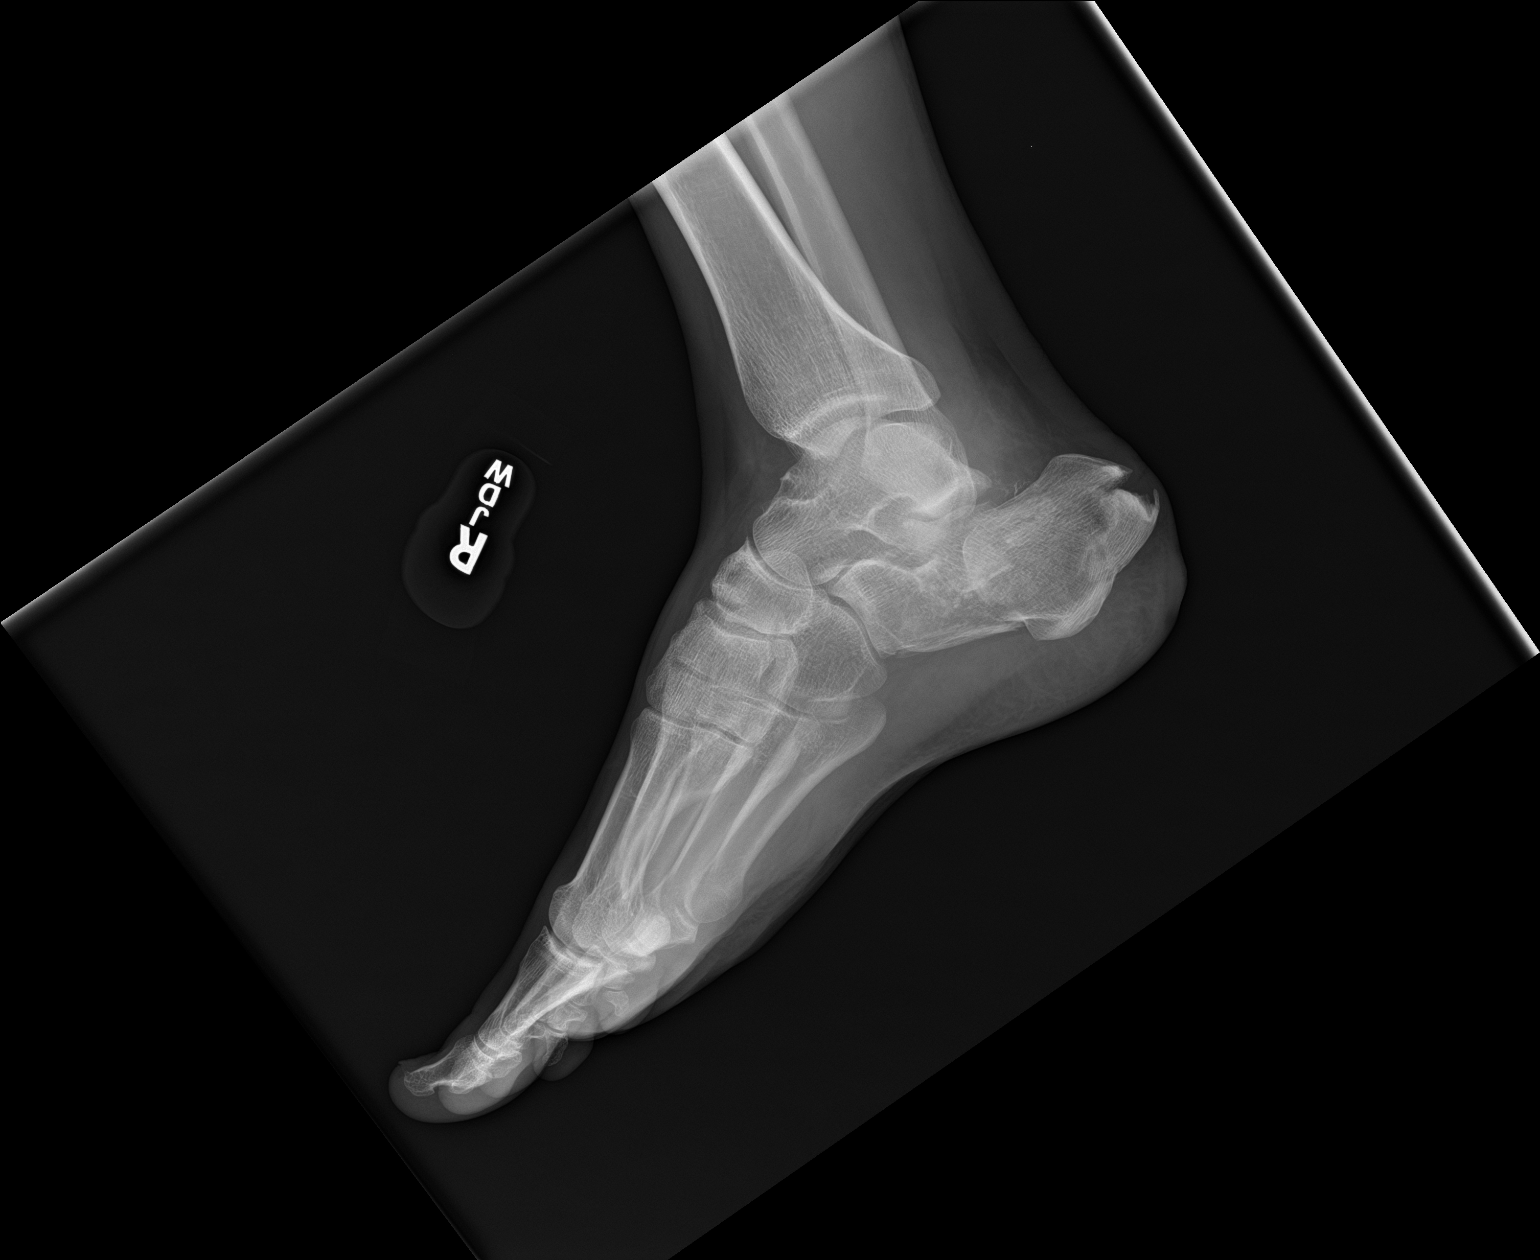

[3 of 3 positions shown; findings below may reference images not displayed]

FINDINGS: There is a comminuted and impacted intra-articular compression
fracture of the calcaneus. This extends into the posterior subtalar
joint. No other tarsal bone fractures are identified. No acute
osseous findings are seen within the forefoot. The alignment is
normal at the Lisfranc joint.
IMPRESSION: Impacted intra-articular compression fracture of the calcaneus.
